# Patient Record
Sex: Male | Born: 1960 | Race: White | Hispanic: No | Smoking: Current every day smoker
Health system: Southern US, Community
[De-identification: ages and names within clinical notes are randomized; demographics above are authoritative.]

## PROBLEM LIST (undated history)

## (undated) DIAGNOSIS — E781 Pure hyperglyceridemia: Secondary | ICD-10-CM

## (undated) DIAGNOSIS — I10 Essential (primary) hypertension: Secondary | ICD-10-CM

## (undated) DIAGNOSIS — E786 Lipoprotein deficiency: Secondary | ICD-10-CM

## (undated) DIAGNOSIS — I2510 Atherosclerotic heart disease of native coronary artery without angina pectoris: Secondary | ICD-10-CM

## (undated) HISTORY — PX: CORONARY ARTERY BYPASS GRAFT: SHX141

## (undated) HISTORY — PX: CARDIAC SURGERY: SHX584

## (undated) HISTORY — PX: APPENDECTOMY: SHX54

---

## 1999-07-10 ENCOUNTER — Ambulatory Visit (HOSPITAL_BASED_OUTPATIENT_CLINIC_OR_DEPARTMENT_OTHER): Admission: RE | Admit: 1999-07-10 | Discharge: 1999-07-10 | Payer: Self-pay | Admitting: Orthopedic Surgery

## 2004-12-24 ENCOUNTER — Emergency Department (HOSPITAL_COMMUNITY): Admission: EM | Admit: 2004-12-24 | Discharge: 2004-12-24 | Payer: Self-pay | Admitting: Emergency Medicine

## 2004-12-26 ENCOUNTER — Encounter: Payer: Self-pay | Admitting: Emergency Medicine

## 2004-12-27 ENCOUNTER — Ambulatory Visit: Payer: Self-pay | Admitting: Cardiology

## 2004-12-27 ENCOUNTER — Encounter: Payer: Self-pay | Admitting: Cardiology

## 2004-12-27 ENCOUNTER — Inpatient Hospital Stay (HOSPITAL_COMMUNITY): Admission: AD | Admit: 2004-12-27 | Discharge: 2004-12-28 | Payer: Self-pay | Admitting: Neurology

## 2005-01-04 ENCOUNTER — Ambulatory Visit: Payer: Self-pay | Admitting: Family Medicine

## 2005-01-17 ENCOUNTER — Ambulatory Visit: Payer: Self-pay | Admitting: Family Medicine

## 2005-03-23 ENCOUNTER — Ambulatory Visit (HOSPITAL_COMMUNITY): Admission: RE | Admit: 2005-03-23 | Discharge: 2005-03-23 | Payer: Self-pay | Admitting: Interventional Radiology

## 2005-03-26 ENCOUNTER — Ambulatory Visit: Payer: Self-pay | Admitting: Family Medicine

## 2005-05-16 ENCOUNTER — Encounter (INDEPENDENT_AMBULATORY_CARE_PROVIDER_SITE_OTHER): Payer: Self-pay | Admitting: Cardiology

## 2005-05-16 ENCOUNTER — Ambulatory Visit (HOSPITAL_COMMUNITY): Admission: RE | Admit: 2005-05-16 | Discharge: 2005-05-16 | Payer: Self-pay | Admitting: Cardiology

## 2005-10-11 ENCOUNTER — Ambulatory Visit: Payer: Self-pay | Admitting: Family Medicine

## 2006-05-23 ENCOUNTER — Ambulatory Visit: Payer: Self-pay | Admitting: Family Medicine

## 2008-05-28 ENCOUNTER — Emergency Department (HOSPITAL_COMMUNITY): Admission: EM | Admit: 2008-05-28 | Discharge: 2008-05-29 | Payer: Self-pay | Admitting: Emergency Medicine

## 2010-06-06 NOTE — Op Note (Signed)
NAME:  ZAXTON, ANGERER NO.:  [id number]   MEDICAL RECORD NO.:  [id number]          PATIENT TYPE:  EMS   LOCATION:  MAJO                         FACILITY:  [facility]   PHYSICIAN:  Dionne Ano. Gramig, M.D.DATE OF BIRTH:  [birth year]   DATE OF PROCEDURE:  05/29/2008  DATE OF DISCHARGE:  05/29/2008                               OPERATIVE REPORT   It has been a pleasure to see Jevaughn Degollado in the [facility]  Emergency Room upon kind referral from Dr. Lorre Nick, emergency room  staff physician.  This patient presents after an on-the-job injury with  an open laceration to his thumb.  This laceration is deep and it runs  across the dorsal aspect and is in continuity with a fracture  classifying it as an open fracture.  I have discussed with the patient  these findings.  In addition to this, I have reviewed his past medical  history.  [place].   PAST MEDICAL HISTORY:  1. Hypercholesterolemia.  2. Appendectomy.  3. Two shoulder surgeries.   SOCIAL HISTORY:  He smokes and occasionally enjoys an alcoholic  beverage.  He does not use any type of drug.  He lives with his family  and he is an active gentleman, 50 years of age.   ALLERGIES:  None.   MEDICINES:  Crestor and aspirin p.r.n.   PHYSICAL EXAMINATION:  GENERAL:  He is alert and oriented, in no acute  distress.  VITAL SIGNS:  Stable.  EXTREMITIES:  Left upper extremity is neurovascularly intact.  Normal  arm, strength, and range of motion.  Lower extremity examination is  benign.  NECK AND BACK:  Nontender and stable.  CHEST:  Clear.  ABDOMEN:  Nontender.  He has no evidence of infection or dystrophic  reaction.  The right thumb has an obvious laceration with open fracture  and nail bed disarray.  I have reviewed this at length.  His x-ray show  a transverse fracture displaced about the distal phalanx.  This is  nonarticular.   IMPRESSION:  Open fracture, right thumb including nailbed injury.   PLAN:  I have discussed and verbally consented for repair and  reconstruction as necessary.  He understands and accepts the risks and  benefits surgery including risk of infection, bleeding, anesthesia,  damage to normal structures, and failure of surgery to accomplish his  intended goals of relieving symptoms and restoring function.   OPERATION IN DETAIL:  The patient was seen by myself and anesthesia and  taken to the operative arena.  I then performed an intermetacarpal and  field block.  He tolerated this well.  He was then prepped and draped  with two-step of Betadine scrub and paints followed by copious amounts  of 3 L of sterile saline, placed through the wound.  Once this was done,  sterile field was secured again and I then repeated the I and D, this  was an excisional debridement of skin, subcutaneous tissue, and bone.  Following this, the bone was reduced and Kirschner wire was  placed  across the fracture site and through the IP joint with finger in  extension.  This was checked under fluoro.  AP and lateral planes were  reviewed and verified and a permanent copies were saved for  documentation, this showed excellent position.  Thus I and D, excisional  in nature as well as ORIF of the bony fracture about the distal phalanx  and stress radiography were performed.   Following this, I then performed nailbed repair with fine chromic suture  under 4-point shoulder magnification, and in addition to this I  performed a complex dorsal laceration repair.  The patient tolerated  this well.  There were no complicating features.  He was placed in a  thumb spica splint.   DISCHARGE MEDICATIONS:  He was discharged home on:  1. Keflex 500 mg 1 p.o. q.i.d. x14 days.  2. Percocet 5 mg 1-2 q.4-6 h. p.r.n. pain p.o.  3. Vitamin C to promote healing.  4. I placed him on Peri-Colace for stool softener  issue.   I have asked him not to smoke.   She will be out of work until Jun 07, 2008, and he can begin live work  duty, sedentary in nature.  No use of the right upper extremity.  We  will see him in therapy for a wound check in 7 days, and I will see him  as well at that time.  The patient did not understand the postoperative  algorithm and course of care.  He tolerated the procedure well.  There  were no complicating features.  Hopefully, the patient will have a  smooth course.  This was an on-the-job injury, it was a fairly  significant open fracture and thus treated him for immediate detention.   PROCEDURES PERFORMED:  1. Incision and debridement of skin, subcutaneous tissue, and bone      excisional in nature, secondary to open fracture.  2. Open reduction and internal fixation, distal phalanx fracture,      Kirschner wire.  3. Nailbed repair, right thumb.  4. Complex tissue closure over a 3 cm area of right thumb.  5. Stress radiography.      Dionne Ano. Amanda Pea, M.D.  Electronically Signed     WMG/MEDQ  D:  05/29/2008  T:  05/29/2008  Job:  [id number]

## 2010-06-09 NOTE — Discharge Summary (Signed)
NAME:  Gabriel Collier, Gabriel Collier           ACCOUNT NO.:  [id number]   MEDICAL RECORD NO.:  [id number]          PATIENT TYPE:  INP   LOCATION:  4711                         FACILITY:  [facility]   PHYSICIAN:  Pramod P. Pearlean Brownie, MD    DATE OF BIRTH:  October 31, 1960   DATE OF ADMISSION:  12/27/2004  DATE OF DISCHARGE:  12/28/2004                                 DISCHARGE SUMMARY   DISCHARGE DIAGNOSIS:  1.  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  2.  Hyperlipidemia.  3.  Borderline diabetes.   HISTORY OF PRESENT ILLNESS:  Gabriel Collier is a 50 year old Caucasian male  who presented to the emergency room with symptoms of headache and left eye  visual disturbance for three days prior to admission. He was initially seen  at emergency room at [facility] where a CT scan of the head was  unremarkable. Lumbar puncture was also normal and he was sent home. He was  seen by Dr. Lysbeth Galas the next day who sent him to emergency room for another  MRI scan, which showed the above-mentioned strokes. He was admitted for  further workup. The patient's vascular risk factors included smoking one and  a half packs per day. On neurological exam, the patient complained of  subjective visual scatoma in the left eye in the upper outer quadrant but  had no objective field defect. He had no focal weakness, sensory loss or  ataxia. His gait was steady. MRI scan findings were stated above. A 2-D  echocardiogram showed normal ejection fraction. MRA of the neck showed  occluded left vertebral artery at its origin. There is a question of  dissection. Lipid profile showed an increased LDL of 124, total cholesterol  183, hemoglobin A1c was normal at 5.4. The patient was started on aspirin  and Plavix secondary to stroke prevention and was advised to follow-up in  the office with Dr. Pearlean Collier in two  months. At that time, cerebral angiogram  would be done to evaluate the vertebral artery for recanalization. If he had  been recurrent symptoms of posterior circulation ischemia, he was advised to  call earlier. The patient was advised not to drive until he had seen by an  eye doctor to check his visual fields. He was also advised to quit smoking  and started on Zocor for his elevated lipids.   MEDICATIONS AT THE TIME OF DISCHARGE:  1.  Aspirin 81 milligrams a day.  2.  Plavix 75 milligrams a day.  3.  Zocor 20 milligrams a day.   He is advised to follow-up with Dr. Pearlean Collier in two months and he was referred  to ophthalmologist, Dr. Raelyn Collier, for visual field testing. The patient  was not to drive until he saw Dr. Pearlean Collier.           ______________________________  Gabriel Schlein. Pearlean Brownie, MD     PPS/MEDQ  D:  03/12/2005  T:  03/12/2005  Job:  [id number]   cc:   Gabriel Collier  Gabriel Collier, M.D.  Fax: [phone]   Gabriel Collier, M.D.  Fax: [phone]

## 2010-06-09 NOTE — Op Note (Signed)
[place]  Patient:    Gabriel Collier, Gabriel Collier                  MRN: [id number] Proc. Date: 07/10/99 Adm. Date:  [id number] Disc. Date: [id number] Attending:  Twana First                           Operative Report  PREOPERATIVE DIAGNOSES: 1. Right shoulder rotator cuff tendinitis/partial tear. 2. Right shoulder acromioclavicular joint spurring and inflammation.  POSTOPERATIVE DIAGNOSES: 1. Right shoulder rotator cuff tendinitis/partial tear. 2. Right shoulder acromioclavicular joint spurring and inflammation.  PROCEDURE: 1. Right shoulder EUA followed by arthroscopic partial rotator cuff tear    debridement and decompression. 2. Right shoulder distal clavicle excision.  SURGEON:  Elana Alm. Thurston Hole, M.D.  ASSISTANT:  Kirstin Adelberger, P.A.  ANESTHESIA:  General.  OPERATIVE TIME:  Forty-five minutes.  COMPLICATIONS:  None.  INDICATIONS FOR PROCEDURE:  The patient is a 50 year old gentleman who initially injured his shoulder in 1999, working at [employer].  Has had persistent pain in the shoulder and underwent shoulder arthroscopic surgery in January of 2000, with a partial rotator cuff tear debridement and decompression. However, continued to have persistent pain despite this with a subsequent MRI shows AC joint inflammation and spurring, and tendinitis.  He has failed conservative and postoperative care, and is now to undergo arthroscopy.  DESCRIPTION OF PROCEDURE:  The patient is brought to the operating room on July 10, 1999, and placed on the operating table in the supine position. After an adequate level of general anesthesia was obtained, the right shoulder was examined under anesthesia.  He had full range of motion and the shoulder was stable to ligamentous exam.  The shoulder was prepped using sterile Betadine and draped using sterile technique.  Originally, through a posterior arthroscopic portal, the arthroscope with a pump  attached was placed, and through an anterior portal, an arthroscope probe was placed.  On initial inspection, the articular cartilage and the glenohumeral joint was normal, and the anterior and posterior labrum normal.  The superior labrum and biceps tendon anchor normal.  The biceps tendon normal.  Inferior labrum and anterior and inferior glenohumeral ligaments normal.  Inferior capsular recess free of pathology.  The rotator cuff showed a partial under surface tear, 20% of the supraspinatus which was partially debrided, but it was intact.  The rest of the rotator cuff was intact.  Subacromial space showed moderate return of bursitis which was resected.  The rotator cuff was intact, moderately frayed and through the lateral portal, I also palpated the cuff.  Again, there was some partial tearing, but no evidence of a complete tear and no need for a repair.  The previous subacromial decompression was found to be satisfactory. The distal clavicle was exposed and using a 6 mm bur, the distal 5-6 mm of the clavicle was resected secondary to spurring and inflammation in this joint.  After this was done, the shoulder could be brought through a full range of motion with no impingement on the rotator cuff.  At this point, I felt that all pathology had been satisfactorily addressed.  The instruments were removed.  The portals were closed with 3-0 nylon suture and injected with 0.25% Marcaine with epinephrine, and sterile dressings applied and a sling, and the patient awakened and taken to the recovery room in stable condition.  FOLLOWUP CARE:  The patient will be followed as an outpatient on  Vicodin and Naprosyn.  I will see him back in the office in a week for sutures out and follow up. DD:  07/10/99 TD:  07/12/99 Job: [id number]

## 2010-06-09 NOTE — H&P (Signed)
NAME:  Gabriel Collier NO.:  [id number]   MEDICAL RECORD NO.:  [id number]          PATIENT TYPE:  INP   LOCATION:  NA                           FACILITY:  [facility]   PHYSICIAN:  Marlan Palau, M.D.  DATE OF BIRTH:  [birth year]   DATE OF ADMISSION:  DATE OF DISCHARGE:                                HISTORY & PHYSICAL   HISTORY OF PRESENT ILLNESS:  [name] is a 50 year old, right  handed, white male, born Jul 09, 1960, with a history of tobacco abuse.  This patient is followed by Dr. Lysbeth Galas and two days ago developed sudden  onset of an occipital headache which is unusual for him.  The patient went  to the emergency room at [facility], underwent a CT scan of the  head that was unremarkable, a lumbar puncture that was unremarkable.  Headache, however, persisted with visual scotoma, a bit more prominent off  to the left than to the right.  The patient was seen by Dr. Lysbeth Galas and was  told to return to the emergency room for an MRI scan of the brain which was  performed.  This showed evidence of left thalamic, left vermian  cerebellar,  and right occipital acute strokes.  MRI angiography was not done with the  study.  Neurology was asked to see this patient for further evaluation.  The  patient has not been on aspirin or any other blood thinners prior to this  admission.  Headache again persists.  The patient did have some nausea,  vomiting initially with the headache, has not reported any numbness or  weakness in the arms or legs.  The patient denies any gait disturbance.   PAST MEDICAL HISTORY:  1.  New onset of cerebrovascular infarctions as above.  2.  Right shoulder surgery in the past.  3.  History of appendectomy.  4.  Borderline diabetes.  5.  Headache as above.   MEDICATIONS:  None.   The patient smokes a pack and a half of cigarettes a day.  He drinks alcohol  on occasion in the form of beer.   He states no known allergies.   SOCIAL HISTORY:  The patient is married, lives in the [place], [place] area, works as a Naval architect, has two children who are alive and  well.   FAMILY MEDICAL HISTORY:  Notable that father had an MI.  Mother had breast  cancer at the time of death.  He has one brother who is alive and well.   REVIEW OF SYSTEMS:  Noted for no fevers or chills.  The patient does note  some neck pain but no neck stiffness.  Denies shortness of breath, chest  pain, abdominal pain, blackout episodes, dizziness.  Some dizziness was  noted at the onset of the headache.   PHYSICAL EXAMINATION:  VITAL SIGNS:  Blood pressure is 122/69, heart rate  58, respiratory rate 17, temperature afebrile.  GENERAL:  This patient is a well developed white male who is alert,  cooperative on examination.  HEENT:  Head is atraumatic.  Eyes:  Pupils are equal, round,  reactive to  light.  Disks are flat bilaterally.  NECK:  Supple.  No carotid bruits noted.  RESPIRATORY:  Clear.  CARDIOVASCULAR:  Reveals a regular rate and rhythm.  No obvious murmurs rubs  noted.  EXTREMITIES:  Without significant edema.  NEUROLOGIC:  Cranial nerves as above.  Facial symmetry is present.  The  patient has good sensation of the face to pinprick, soft touch bilaterally.  He has good strength of the facial muscles, muscles for head turning,  shoulder shrug bilaterally.  Speech is well enunciated, not aphasic.  Motor  testing reveals 5/5 strength in all fours.  Good symmetric motor tone is  noted throughout.  Sensory testing is intact to pinprick, soft touch,  vibratory sensation throughout.  Position sense is intact to all fours.  No  evidence of extinction  is noted.  The patient has good finger-to-nose-  finger, toe-to-finger bilaterally.  Gait was not tested.  Deep tendon  reflexes symmetric normal.  Toes downgoing bilaterally.   LABORATORY VALUES:  Notable for a white count of 8.5, hemoglobin of 14.7,  hematocrit of 42.8, MCV of  92.6, platelets of 229.  INR of 1.0.  Sodium 137,  potassium 3.9, chloride of 105, CO2 of 26, glucose of 121, BUN of 12,  creatinine 0.9.  Total bili 0.6, alk phosphatase 45, SGOT 14, SGPT 21, total  protein 6.5, albumin of 3.6, calcium of 9.1.  Lipase of 22.   Chest x-ray and EKG are pending.   IMPRESSION:  1.  Bilateral posterior circulation cerebrovascular infarctions.  2.  Headache.  The patient had onset of symptoms associated with headache.      Do need to consider the possibility of a vertebral dissection, rule out      cardiogenic embolus to the posterior circulation.  __________  the      patient has had bilateral acute infarctions.  Further workup is      indicated.   PLAN:  1.  Admission to [facility].  2.  MRI angiogram of the intracranial, extra cranial vessels, consider      cerebral angiogram.  3.  A 2D echocardiogram.  4.  Aspirin therapy.  5.  IV fluids.   We will follow the patient's clinical course while in-house.  Thank you very  much.      Marlan Palau, M.D.  Electronically Signed     CKW/MEDQ  D:  12/26/2004  T:  12/26/2004  Job:  [id number]   cc:   Delaney Meigs, M.D.  Fax: [phone]

## 2013-02-11 ENCOUNTER — Emergency Department (HOSPITAL_COMMUNITY): Payer: Self-pay

## 2013-02-11 ENCOUNTER — Emergency Department (HOSPITAL_COMMUNITY)
Admission: EM | Admit: 2013-02-11 | Discharge: 2013-02-11 | Disposition: A | Discharge status: Home / Self Care | Payer: Self-pay | Attending: Emergency Medicine | Admitting: Emergency Medicine

## 2013-02-11 ENCOUNTER — Encounter (HOSPITAL_COMMUNITY): Payer: Self-pay | Admitting: Emergency Medicine

## 2013-02-11 DIAGNOSIS — E786 Lipoprotein deficiency: Secondary | ICD-10-CM | POA: Insufficient documentation

## 2013-02-11 DIAGNOSIS — F17200 Nicotine dependence, unspecified, uncomplicated: Secondary | ICD-10-CM | POA: Insufficient documentation

## 2013-02-11 DIAGNOSIS — R55 Syncope and collapse: Secondary | ICD-10-CM | POA: Insufficient documentation

## 2013-02-11 DIAGNOSIS — Z951 Presence of aortocoronary bypass graft: Secondary | ICD-10-CM | POA: Insufficient documentation

## 2013-02-11 DIAGNOSIS — R296 Repeated falls: Secondary | ICD-10-CM | POA: Insufficient documentation

## 2013-02-11 DIAGNOSIS — Z79899 Other long term (current) drug therapy: Secondary | ICD-10-CM | POA: Insufficient documentation

## 2013-02-11 DIAGNOSIS — S0100XA Unspecified open wound of scalp, initial encounter: Secondary | ICD-10-CM | POA: Insufficient documentation

## 2013-02-11 DIAGNOSIS — Z9889 Other specified postprocedural states: Secondary | ICD-10-CM | POA: Insufficient documentation

## 2013-02-11 DIAGNOSIS — I2510 Atherosclerotic heart disease of native coronary artery without angina pectoris: Secondary | ICD-10-CM | POA: Insufficient documentation

## 2013-02-11 DIAGNOSIS — I10 Essential (primary) hypertension: Secondary | ICD-10-CM | POA: Insufficient documentation

## 2013-02-11 DIAGNOSIS — Z7982 Long term (current) use of aspirin: Secondary | ICD-10-CM | POA: Insufficient documentation

## 2013-02-11 DIAGNOSIS — Y939 Activity, unspecified: Secondary | ICD-10-CM | POA: Insufficient documentation

## 2013-02-11 DIAGNOSIS — E781 Pure hyperglyceridemia: Secondary | ICD-10-CM | POA: Insufficient documentation

## 2013-02-11 DIAGNOSIS — Y929 Unspecified place or not applicable: Secondary | ICD-10-CM | POA: Insufficient documentation

## 2013-02-11 DIAGNOSIS — R42 Dizziness and giddiness: Secondary | ICD-10-CM | POA: Insufficient documentation

## 2013-02-11 HISTORY — DX: Lipoprotein deficiency: E78.6

## 2013-02-11 HISTORY — DX: Essential (primary) hypertension: I10

## 2013-02-11 HISTORY — DX: Pure hyperglyceridemia: E78.1

## 2013-02-11 HISTORY — DX: Atherosclerotic heart disease of native coronary artery without angina pectoris: I25.10

## 2013-02-11 LAB — PROTIME-INR
INR: 0.94 (ref 0.00–1.49)
Prothrombin Time: 12.4 seconds (ref 11.6–15.2)

## 2013-02-11 LAB — COMPREHENSIVE METABOLIC PANEL
ALT: 17 U/L (ref 0–53)
AST: 14 U/L (ref 0–37)
Albumin: 4.1 g/dL (ref 3.5–5.2)
Alkaline Phosphatase: 68 U/L (ref 39–117)
BILIRUBIN TOTAL: 0.4 mg/dL (ref 0.3–1.2)
BUN: 10 mg/dL (ref 6–23)
CALCIUM: 9.1 mg/dL (ref 8.4–10.5)
CHLORIDE: 99 meq/L (ref 96–112)
CO2: 23 meq/L (ref 19–32)
CREATININE: 0.71 mg/dL (ref 0.50–1.35)
GFR calc Af Amer: >90 mL/min (ref >90)
Glucose, Bld: 114 mg/dL — ABNORMAL HIGH (ref 70–99)
Potassium: 4.5 mEq/L (ref 3.7–5.3)
Sodium: 136 mEq/L — ABNORMAL LOW (ref 137–147)
Total Protein: 7.3 g/dL (ref 6.0–8.3)

## 2013-02-11 LAB — CBC WITH DIFFERENTIAL/PLATELET
BASOS ABS: 0 10*3/uL (ref 0.0–0.1)
BASOS PCT: 0 % (ref 0–1)
EOS PCT: 0 % (ref 0–5)
Eosinophils Absolute: 0.1 10*3/uL (ref 0.0–0.7)
HEMATOCRIT: 43.9 % (ref 39.0–52.0)
HEMOGLOBIN: 15.9 g/dL (ref 13.0–17.0)
LYMPHS PCT: 7 % — AB (ref 12–46)
Lymphs Abs: 1.2 10*3/uL (ref 0.7–4.0)
MCH: 33.3 pg (ref 26.0–34.0)
MCHC: 36.2 g/dL — AB (ref 30.0–36.0)
MCV: 91.8 fL (ref 78.0–100.0)
MONO ABS: 0.8 10*3/uL (ref 0.1–1.0)
MONOS PCT: 5 % (ref 3–12)
Neutro Abs: 15.4 10*3/uL — ABNORMAL HIGH (ref 1.7–7.7)
Neutrophils Relative %: 88 % — ABNORMAL HIGH (ref 43–77)
Platelets: 235 10*3/uL (ref 150–400)
RBC: 4.78 MIL/uL (ref 4.22–5.81)
RDW: 12.6 % (ref 11.5–15.5)
WBC: 17.5 10*3/uL — ABNORMAL HIGH (ref 4.0–10.5)

## 2013-02-11 LAB — URINALYSIS, ROUTINE W REFLEX MICROSCOPIC
BILIRUBIN URINE: NEGATIVE
GLUCOSE, UA: NEGATIVE mg/dL
HGB URINE DIPSTICK: NEGATIVE
KETONES UR: NEGATIVE mg/dL
Leukocytes, UA: NEGATIVE
Nitrite: NEGATIVE
PH: 5.5 (ref 5.0–8.0)
PROTEIN: NEGATIVE mg/dL
Specific Gravity, Urine: 1.011 (ref 1.005–1.030)
Urobilinogen, UA: 0.2 mg/dL (ref 0.0–1.0)

## 2013-02-11 LAB — TYPE AND SCREEN
ABO/RH(D): B POS
ANTIBODY SCREEN: NEGATIVE

## 2013-02-11 LAB — POCT I-STAT TROPONIN I: Troponin i, poc: 0 ng/mL (ref 0.00–0.08)

## 2013-02-11 LAB — ABO/RH: ABO/RH(D): B POS

## 2013-02-11 NOTE — ED Provider Notes (Signed)
Medical screening examination/treatment/procedure(s) were performed by non-physician practitioner and as supervising physician I was immediately available for consultation/collaboration.  EKG Interpretation    Date/Time:  Wednesday February 11 2013 10:35:16 EST Ventricular Rate:  56 PR Interval:  154 QRS Duration: 84 QT Interval:  405 QTC Calculation: 391 R Axis:   88 Text Interpretation:  Sinus rhythm Probable anterior infarct, age indeterminate No significant change since last tracing Confirmed by Janiyla Long  MD-J, Kelin Borum ([id number]) on 02/11/2013 11:28:01 AM              Celene KrasJon R Cristel Rail, MD 02/11/13 1128

## 2013-02-11 NOTE — ED Notes (Signed)
Patient transported to X-ray 

## 2013-02-11 NOTE — ED Provider Notes (Signed)
CSN: [id number]     Arrival date & time 02/11/13  [id number] History   First MD Initiated Contact with Patient 02/11/13 1002     Chief Complaint  Patient presents with  . Loss of Consciousness  . Fall   (Consider location/radiation/quality/duration/timing/severity/associated sxs/prior Treatment) HPI Comments: Patient is 53 year old male who presents to the ED after having syncopal event at work today.  He has a PMHx significant for CAD with CABG in 2012, HTN and hyperlipidemia.  He states that he did not have breakfast this morning and had two cups of coffee with sugar.  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  He states the next thing he remembers is waking up on the ground.  He reports no history of syncope in the past.  He struck his right parietal scalp on the concrete during this event but denies headache or neck pain.  He denies headache, blurred vision, history of seizures, nausea, vomiting, recent illness, neck pain, back pain, chest pain, shortness of breath, abdominal pain, melena or BRBPR, dysuria, hematuria, diarrhea, constipation.  He reports that all of his medical care is done in [place] and [facility].  Patient is a 53 y.o. male presenting with syncope and fall. The history is provided by the patient. No language interpreter was used.  Loss of Consciousness Episode history:  Single Most recent episode:  Today Duration:  30 seconds Timing:  Rare Progression:  Resolved Chronicity:  New Context: normal activity   Context: not blood draw, not dehydration, not exertion, not medication change, not sitting down, not standing up and not urination   Witnessed: yes   Relieved by:  Nothing Worsened by:  Nothing tried Ineffective treatments:  None tried Associated symptoms: dizziness   Associated symptoms: no anxiety, no chest pain, no  confusion, no diaphoresis, no fever, no focal sensory loss, no focal weakness, no headaches, no nausea, no palpitations, no recent fall, no recent injury, no recent surgery, no rectal bleeding, no seizures, no shortness of breath, no vomiting and no weakness   Risk factors: coronary artery disease   Risk factors: no seizures and no vascular disease   Fall Pertinent negatives include no chest pain, diaphoresis, fever, headaches, nausea, vomiting or weakness.    Past Medical History  Diagnosis Date  . Hypertension   . 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 Coronary artery disease    Past Surgical History  Procedure Laterality Date  . Coronary artery bypass graft      triple  . Appendectomy    . Cardiac surgery     History reviewed. No pertinent family history. History  Substance Use Topics  . Smoking status: Current Every Day Smoker -- 1.00 packs/day    Types: Cigarettes  . Smokeless tobacco: Never Used  . Alcohol Use: 3.0 oz/week    5 Cans of beer per week    Review of Systems  Constitutional: Negative for fever and diaphoresis.  Respiratory: Negative for shortness of breath.   Cardiovascular: Positive for syncope. Negative for chest pain and palpitations.  Gastrointestinal: Negative for nausea and vomiting.  Neurological: Positive for dizziness. Negative for focal weakness, seizures, weakness and headaches.  Psychiatric/Behavioral: Negative for confusion.  All other systems reviewed and are negative.    Allergies  Review of patient's allergies indicates no known allergies.  Home  Medications   Current Outpatient Rx  Name  Route  Sig  Dispense  Refill  . aspirin 325 MG EC tablet   Oral   Take 325 mg by mouth daily.         . metoprolol (LOPRESSOR) 50 MG tablet   Oral   Take 25 mg by mouth 2 (two) times daily.         . pravastatin (PRAVACHOL) 40 MG tablet   Oral   Take 40 mg by mouth daily.          BP  145/71  Pulse 78  Temp(Src) 97.8 F (36.6 C) (Oral)  Resp 21  SpO2 97% Physical Exam  Nursing note and vitals reviewed. Constitutional: He is oriented to person, place, and time. He appears well-developed and well-nourished. No distress.  HENT:  Head: Normocephalic.  Right Ear: External ear normal.  Left Ear: External ear normal.  Nose: Nose normal.  Mouth/Throat: Oropharynx is clear and moist. No oropharyngeal exudate.  Small (0.25cm) puncture wound to right parietal scalp, no scalp tenderness to palpation.  Eyes: Conjunctivae and EOM are normal. Pupils are equal, round, and reactive to light. No scleral icterus.  Neck: Normal range of motion and full passive range of motion without pain. Neck supple. No spinous process tenderness and no muscular tenderness present.  Cardiovascular: Normal rate, regular rhythm, normal heart sounds and intact distal pulses.  Exam reveals no gallop and no friction rub.   No murmur heard. Pulmonary/Chest: Effort normal and breath sounds normal. No respiratory distress. He has no wheezes. He has no rales. He exhibits no tenderness.  Well healed midline sternal scar  Abdominal: Soft. Bowel sounds are normal. He exhibits no distension and no mass. There is no tenderness. There is no rebound and no guarding.  Musculoskeletal: Normal range of motion. He exhibits no edema and no tenderness.  Lymphadenopathy:    He has no cervical adenopathy.  Neurological: He is alert and oriented to person, place, and time. No cranial nerve deficit. He exhibits normal muscle tone. Coordination normal.  Skin: Skin is warm and dry. No rash noted. No erythema. No pallor.  Psychiatric: He has a normal mood and affect. His behavior is normal. Judgment and thought content normal.    ED Course  Procedures (including critical care time) Labs Review Labs Reviewed - No data to display Imaging Review No results found.  EKG Interpretation   None      11:24 AM Care of patient  turned over to A. Harris, PA-C who will finish note and disposition of the patient. MDM      Izola Price Marisue Humble, PA-C 02/11/13 1125

## 2013-02-11 NOTE — ED Notes (Signed)
[place] [employer] [place] [facility] with c/o LOC and seizure like activity reported by co-workers.  Pt states he remembers getting dizzy but does not remember anything else.  Guys at site said pt hit his head on the edge of concrete slab.  Pt in NAD, A&O.

## 2013-02-11 NOTE — ED Provider Notes (Signed)
Medical screening examination/treatment/procedure(s) were performed by non-physician practitioner and as supervising physician I was immediately available for consultation/collaboration.  EKG Interpretation    Date/Time:  Wednesday February 11 2013 10:35:16 EST Ventricular Rate:  56 PR Interval:  154 QRS Duration: 84 QT Interval:  405 QTC Calculation: 391 R Axis:   88 Text Interpretation:  Sinus rhythm Probable anterior infarct, age indeterminate No significant change since last tracing Confirmed by Pasqualina Colasurdo  MD-J, Katalin Colledge ([id number]) on 02/11/2013 11:28:01 AM              Celene KrasJon R Angeliki Mates, MD 02/11/13 1544

## 2013-02-11 NOTE — Discharge Instructions (Signed)
Syncope Syncope is a fainting spell. This means the person loses consciousness and drops to the ground. The person is generally unconscious for less than 5 minutes. The person may have some muscle twitches for up to 15 seconds before waking up and returning to normal. Syncope occurs more often in elderly people, but it can happen to anyone. While most causes of syncope are not dangerous, syncope can be a sign of a serious medical problem. It is important to seek medical care.  CAUSES  Syncope is caused by a sudden decrease in blood flow to the brain. The specific cause is often not determined. Factors that can trigger syncope include:  Taking medicines that lower blood pressure.  Sudden changes in posture, such as standing up suddenly.  Taking more medicine than prescribed.  Standing in one place for too long.  Seizure disorders.  Dehydration and excessive exposure to heat.  Low blood sugar (hypoglycemia).  Straining to have a bowel movement.  Heart disease, irregular heartbeat, or other circulatory problems.  Fear, emotional distress, seeing blood, or severe pain. SYMPTOMS  Right before fainting, you may:  Feel dizzy or lightheaded.  Feel nauseous.  See all white or all black in your field of vision.  Have cold, clammy skin. DIAGNOSIS  Your caregiver will ask about your symptoms, perform a physical exam, and perform electrocardiography (ECG) to record the electrical activity of your heart. Your caregiver may also perform other heart or blood tests to determine the cause of your syncope. TREATMENT  In most cases, no treatment is needed. Depending on the cause of your syncope, your caregiver may recommend changing or stopping some of your medicines. HOME CARE INSTRUCTIONS  Have someone stay with you until you feel stable.  Do not drive, operate machinery, or play sports until your caregiver says it is okay.  Keep all follow-up appointments as directed by your  caregiver.  Lie down right away if you start feeling like you might faint. Breathe deeply and steadily. Wait until all the symptoms have passed.  Drink enough fluids to keep your urine clear or pale yellow.  If you are taking blood pressure or heart medicine, get up slowly, taking several minutes to sit and then stand. This can reduce dizziness. SEEK IMMEDIATE MEDICAL CARE IF:   You have a severe headache.  You have unusual pain in the chest, abdomen, or back.  You are bleeding from the mouth or rectum, or you have black or tarry stool.  You have an irregular or very fast heartbeat.  You have pain with breathing.  You have repeated fainting or seizure-like jerking during an episode.  You faint when sitting or lying down.  You have confusion.  You have difficulty walking.  You have severe weakness.  You have vision problems. If you fainted, call your local emergency services (911 in [place] Do not drive yourself to the hospital.  MAKE SURE YOU:  Understand these instructions.  Will watch your condition.  Will get help right away if you are not doing well or get worse. Document Released: 01/08/2005 Document Revised: 07/10/2011 Document Reviewed: 03/09/2011 [facility] Patient Information 2014 [place], [place].  Driving and Equipment Restrictions Some medical problems make it dangerous to drive, ride a bike, or use machines. Some of these problems are:  A hard blow to the head (concussion).  Passing out (fainting).  Twitching and shaking (seizures).  Low blood sugar.  Taking medicine to help you relax (sedatives).  Taking pain medicines.  Wearing an eye  patch.  Wearing splints. This can make it hard to use parts of your body that you need to drive safely. HOME CARE   Do not drive until your doctor says it is okay.  Do not use machines until your doctor says it is okay. You may need a form signed by your doctor (medical release) before you can drive again. You  may also need this form before you do other tasks where you need to be fully alert. MAKE SURE YOU:  Understand these instructions.  Will watch your condition.  Will get help right away if you are not doing well or get worse. Document Released: 02/16/2004 Document Revised: 04/02/2011 Document Reviewed: 05/18/2009 [facility] Patient Information 2014 [place].

## 2013-02-11 NOTE — ED Provider Notes (Signed)
11:00 AM [facility] Here for syncopal event. Patient at work doing heavy lifting. Prodrome of dizziness before syncope and collapse.  The patiient has a history of CAD.  Previous triple coronary bypass.  The patient had a previous episode of syncope and collapse one year ago.  He was seen at the 4 site emergency department and released.  Patient did follow up with his cardiologist and neurologist was cleared.  He should reports that he did not eat any food prior to both episodes of syncope.   1:08 PM BP 142/72  Pulse 66  Temp(Src) 97.8 F (36.6 C) (Oral)  Resp 15  SpO2 100% Filed Vitals:   02/11/13 1244 02/11/13 1300 02/11/13 1336 02/11/13 1337  BP:  127/60 121/66   Pulse:  69  66  Temp:      TempSrc:      Resp:      SpO2: 100% 99%  99%    Patient work up negative. I have discussed the case with Dr. Lynelle DoctorKnapp and the patient. As he has had previous work up and followed up appropriately I feel the patient may do the same this time. He is low risk on [place]. Syncope score.  Driving/ equip restrictions given.  Patient is to follow up with his pcp/ cardio/neuro. Return precautions discussed.  Arthor CaptainAbigail Darryll Raju, PA-C 02/11/13 1527

## 2015-07-15 IMAGING — CT CT HEAD W/O CM
2 series · 16 of 30 positions shown, 18 images · non-contrast
Comparison: Brain CT December 24, 2004 and brain MRI December 26, 2004

CLINICAL DATA: Loss of consciousness with fall

EXAM:
CT HEAD WITHOUT CONTRAST
TECHNIQUE: Contiguous axial images were obtained from the base of the skull
through the vertex without intravenous contrast.

[Series 2: head w/o · axial · non-contrast · 0.43mm/px · z∈[+78,+203]mm · 8 of 33 slices shown, 10 images]
[im 4/33  brain]
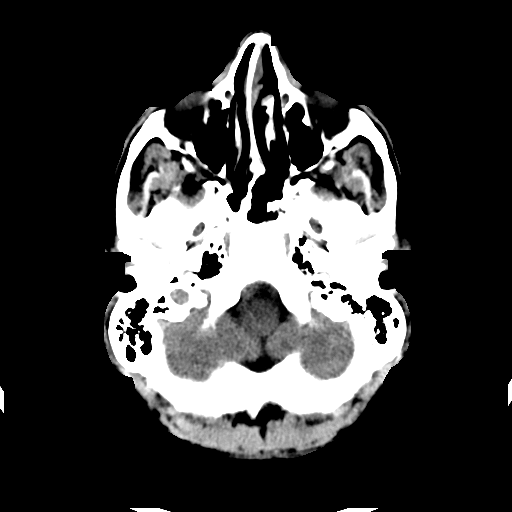
[im 4/33  bone]
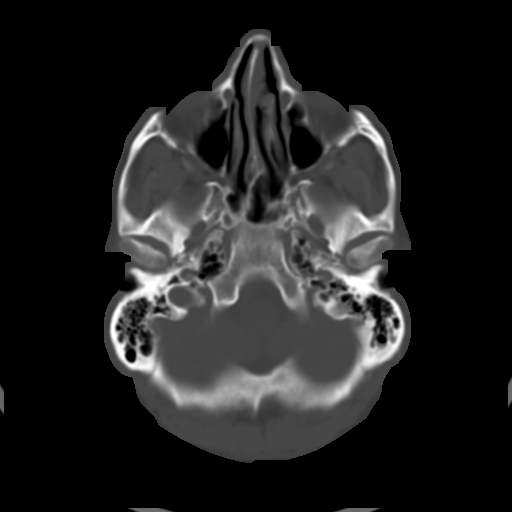
[im 8/33  brain]
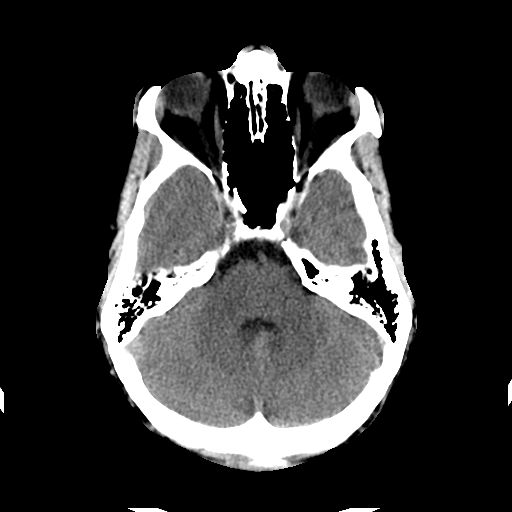
[im 11/33  brain]
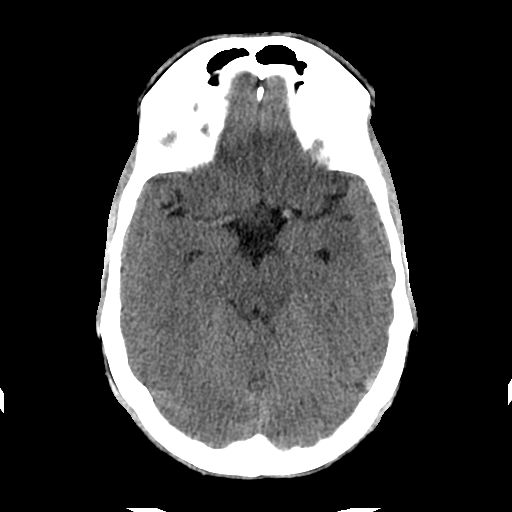
[im 15/33  brain]
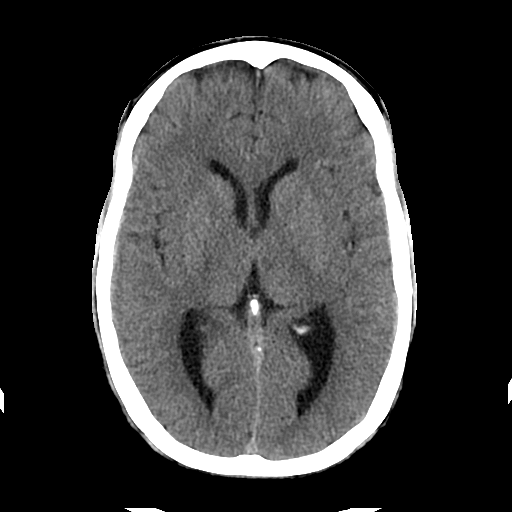
[im 18/33  brain]
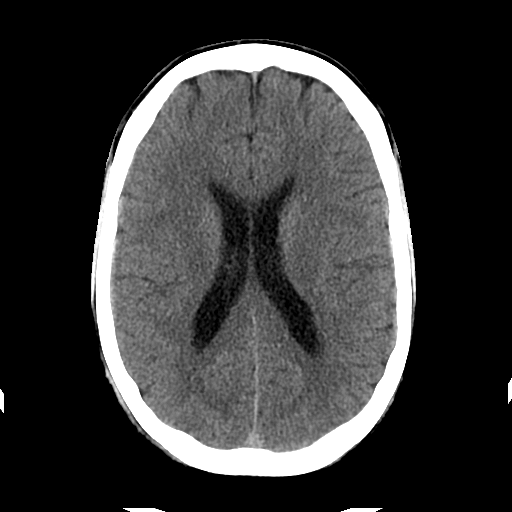
[im 18/33  bone]
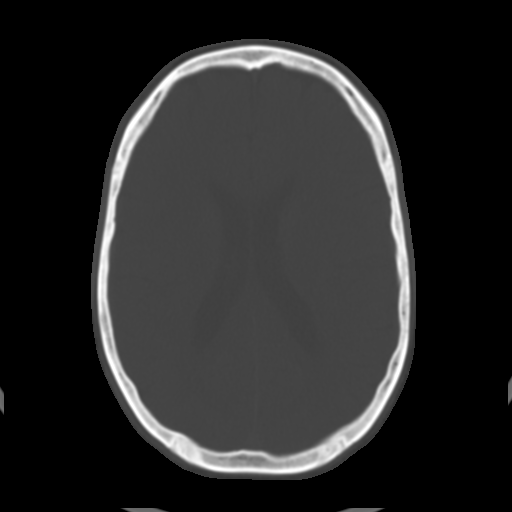
[im 22/33  brain]
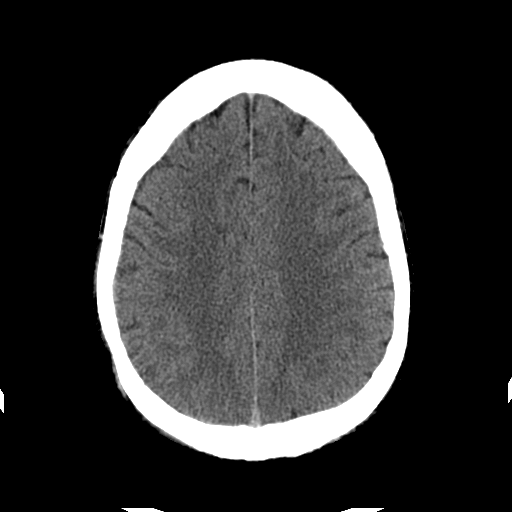
[im 25/33  brain]
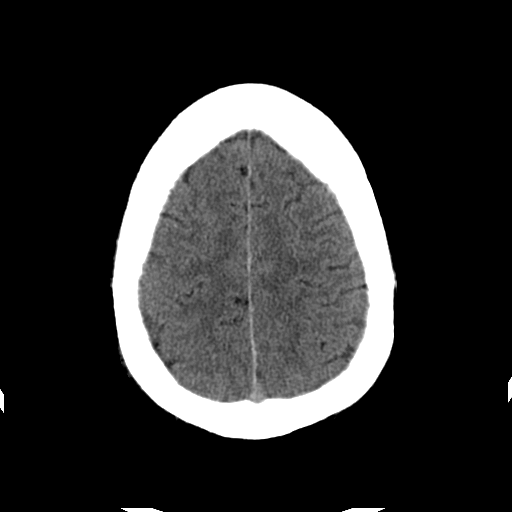
[im 29/33  brain]
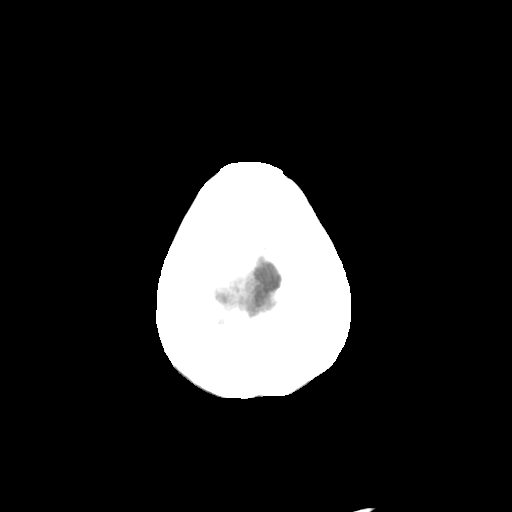

[Series 3: head w/o bone · axial · non-contrast · 0.43mm/px · z∈[+78,+205]mm · 8 of 65 slices shown]
[im 7/65  bone]
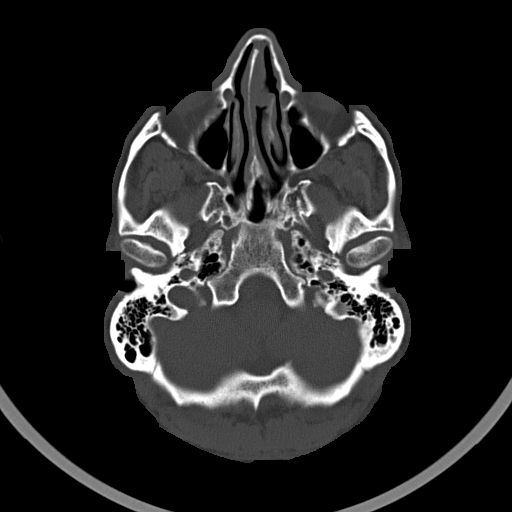
[im 14/65  bone]
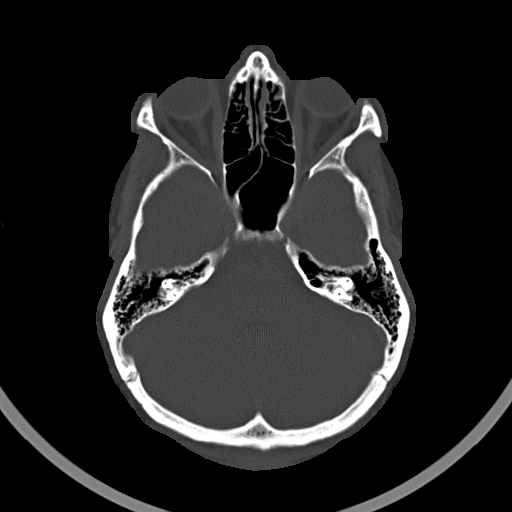
[im 21/65  bone]
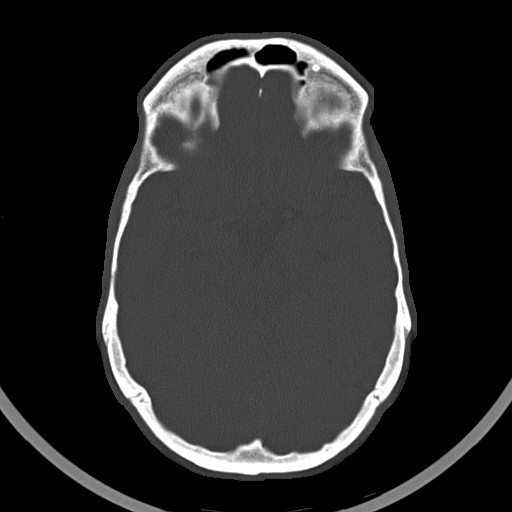
[im 27/65  bone]
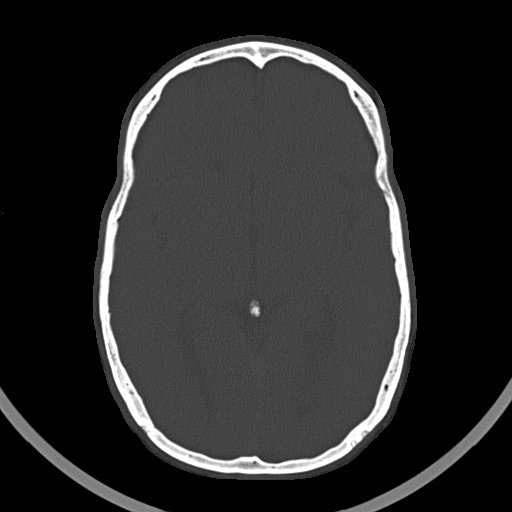
[im 38/65  bone]
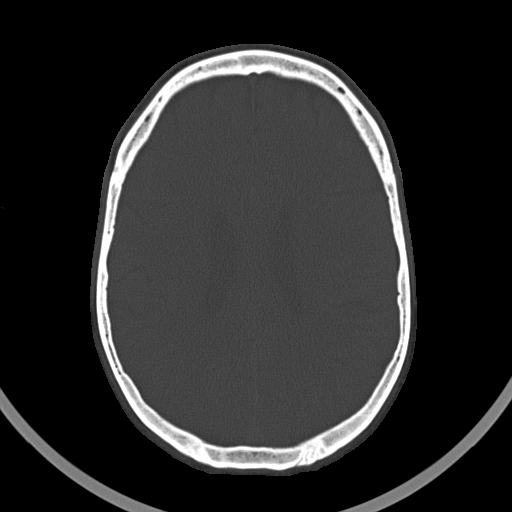
[im 44/65  bone]
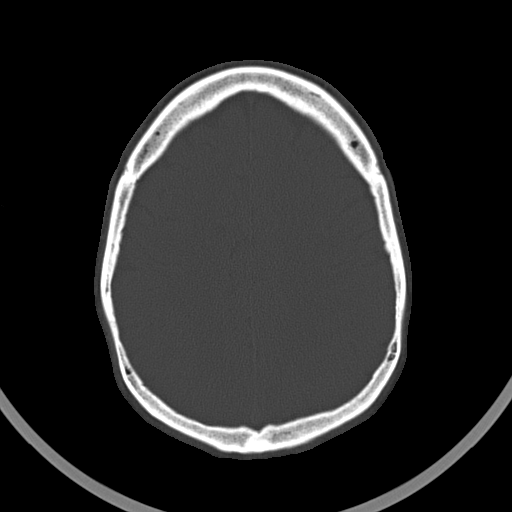
[im 51/65  bone]
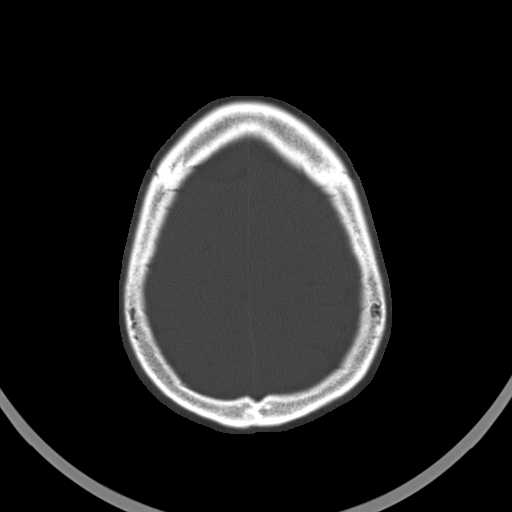
[im 58/65  bone]
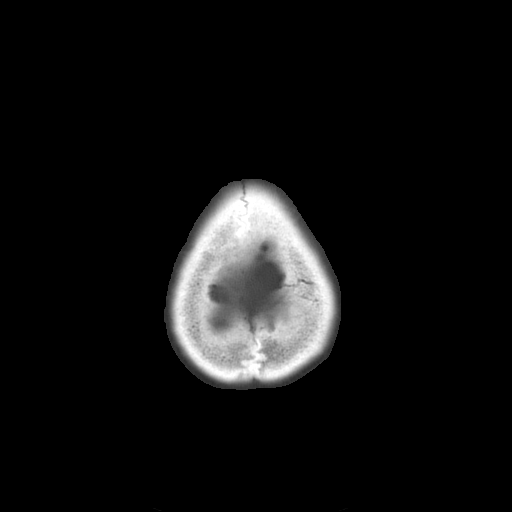

[16 of 30 positions shown; findings below may reference images not displayed]

FINDINGS: The ventricles are normal in size and configuration. There is no
mass, hemorrhage, extra-axial fluid collection, or midline shift.

There is decreased attenuation in the anterior left thalamus in an
area of prior infarct. Subtle decreased attenuation is noted in the
post rim medial superior right cerebellum in an area of documented
prior infarct. There is also a minimal decreased attenuation just
posterior to the quadrigeminal plate cistern on the left consistent
with prior infarct demonstrated previously. No new gray-white
compartment lesions are identified. There is no demonstrable acute
infarct on this study.

The bony calvarium appears intact. The mastoid air cells are clear.
There is rightward deviation of the nasal septum. There is a
questionable polyp in the left naris.
IMPRESSION: There are prior small infarcts as noted above. There is no
intracranial mass, hemorrhage, or acute appearing infarct.

Suspect left-sided nasal polyp causing a localized obstruction of
the left naris. This area may warrant direct visualization. There is
rightward deviation of the nasal septum.

## 2015-07-15 IMAGING — CR DG CHEST 2V
2 series · 2 of 2 positions shown · non-contrast
Comparison: December 27, 2004

CLINICAL DATA: Loss of consciousness with trauma

EXAM:
CHEST  2 VIEW

[w chest lat]
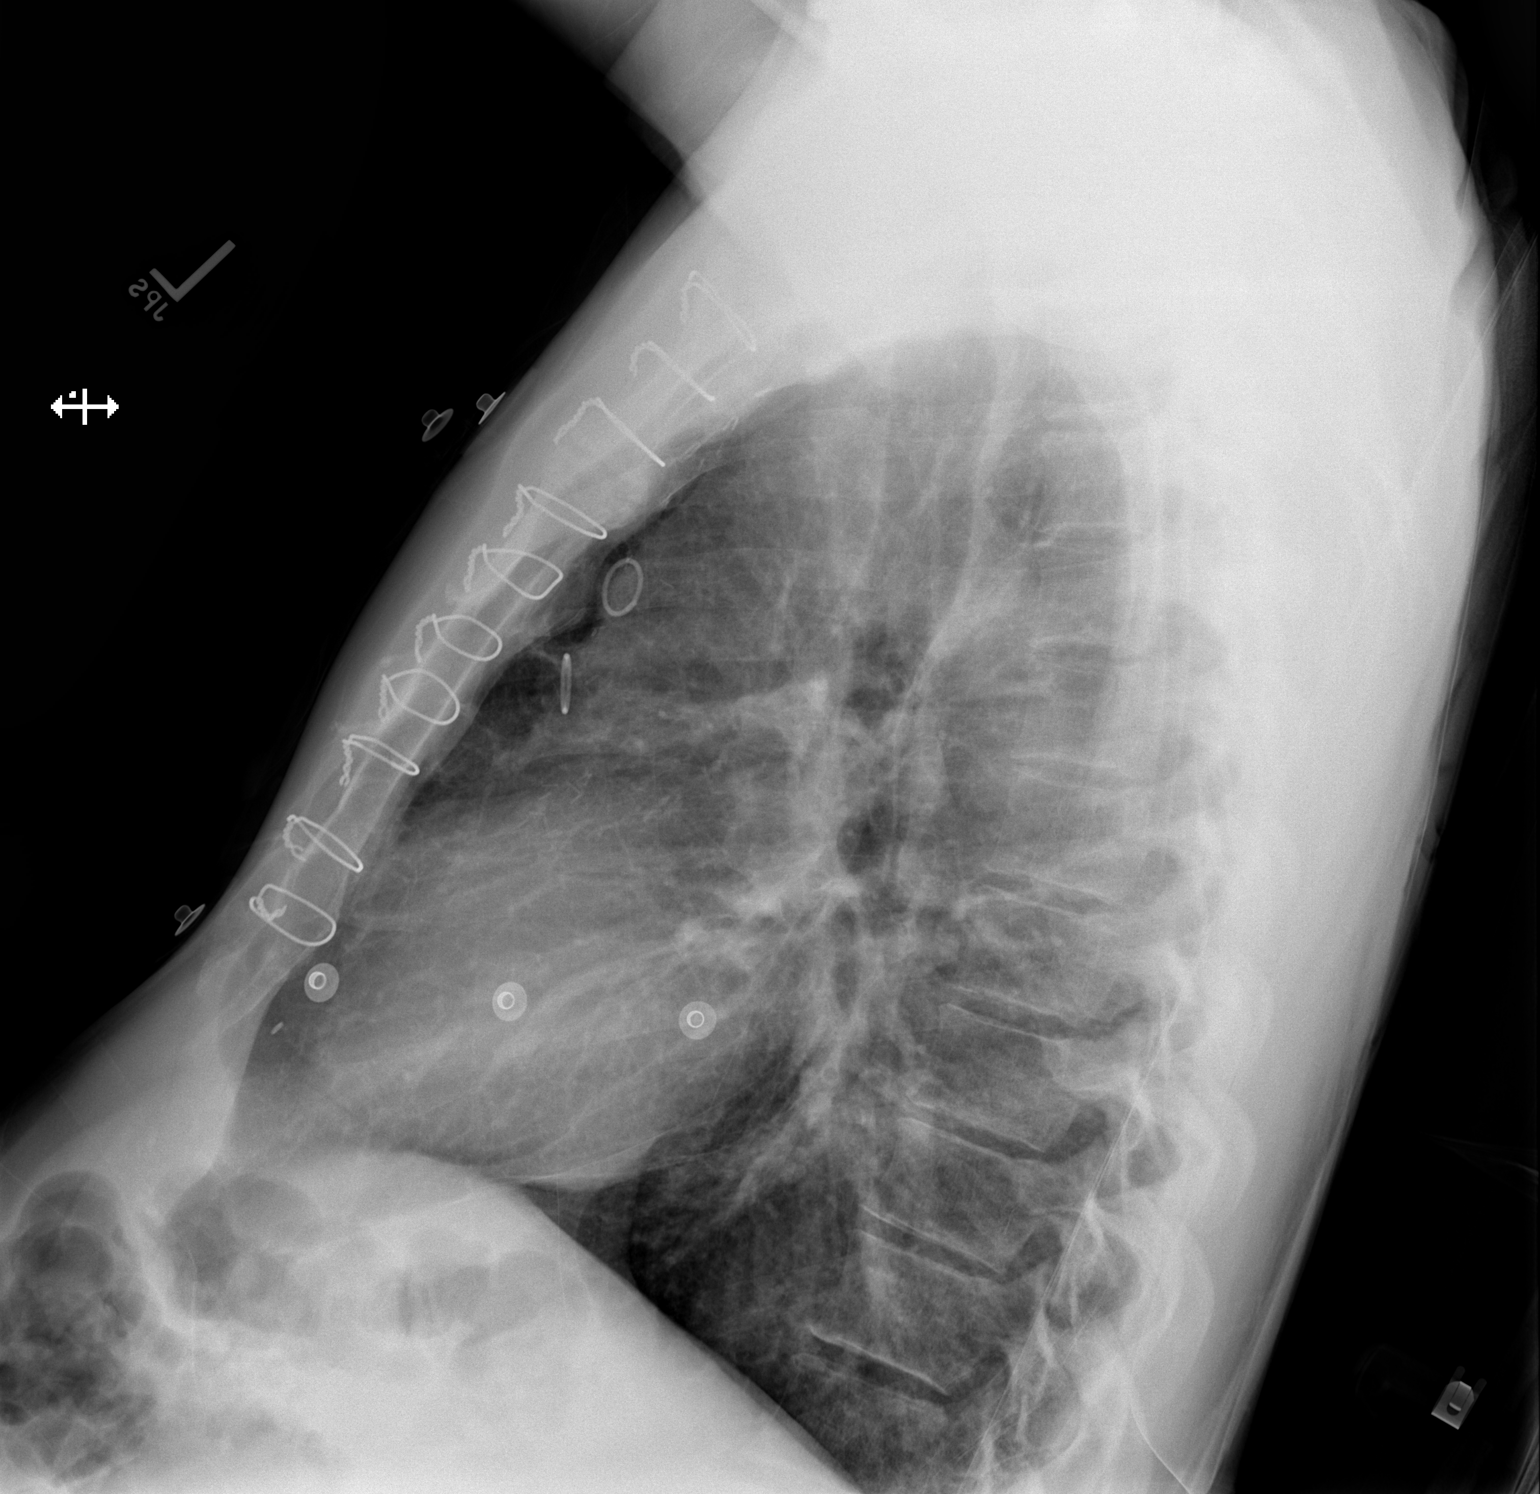

[w chest pa]
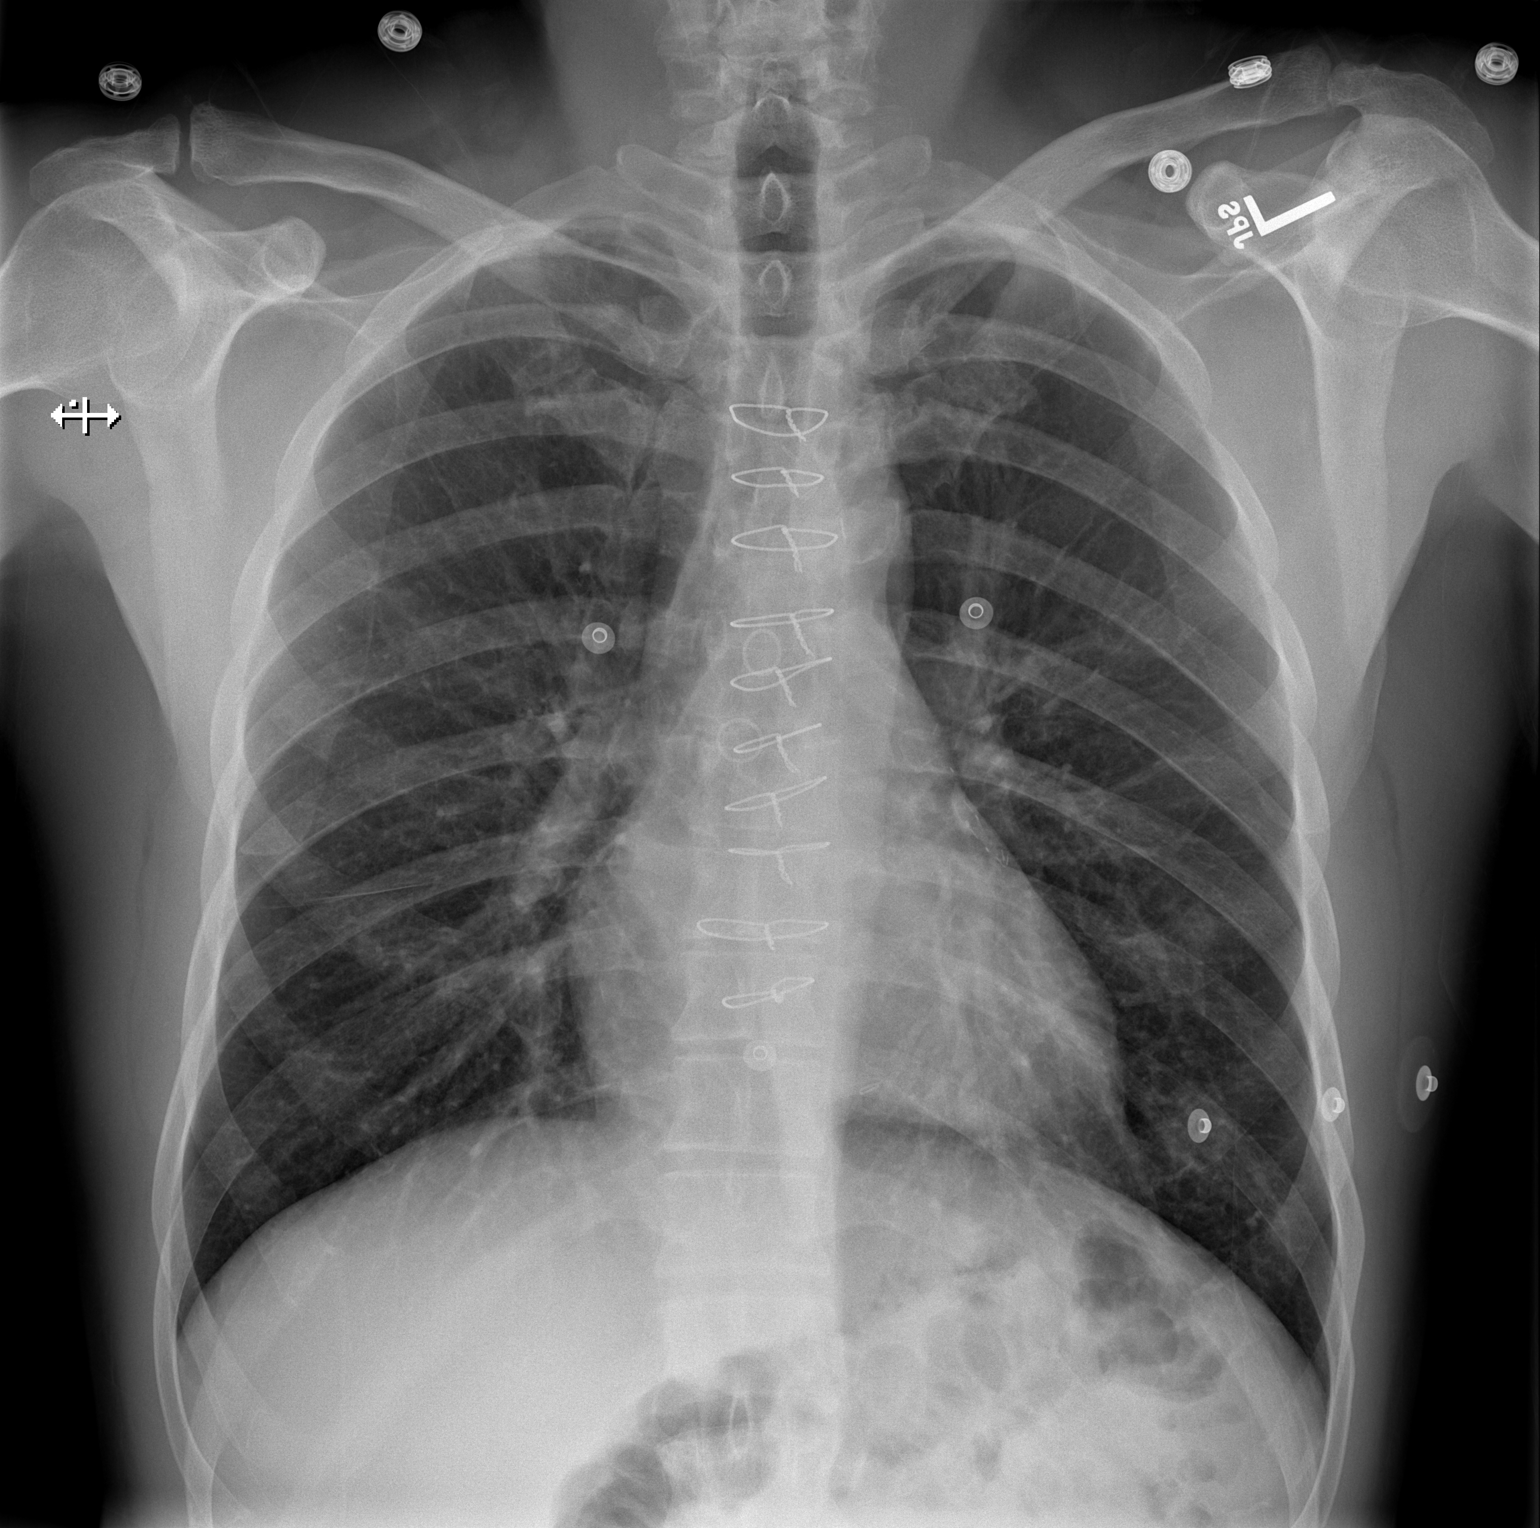

[2 of 2 positions shown; findings below may reference images not displayed]

FINDINGS: Lungs are clear. Heart size and pulmonary vascularity are normal. No
adenopathy. No bone lesions. Patient is status post coronary artery
bypass grafting. No pneumothorax.
IMPRESSION: No edema or consolidation. No pneumothorax. Patient is status post
coronary artery bypass grafting.

## 2019-03-13 ENCOUNTER — Emergency Department (HOSPITAL_COMMUNITY)
Admission: EM | Admit: 2019-03-13 | Discharge: 2019-03-23 | Disposition: E | Payer: Self-pay | Attending: Emergency Medicine | Admitting: Emergency Medicine

## 2019-03-13 DIAGNOSIS — I2510 Atherosclerotic heart disease of native coronary artery without angina pectoris: Secondary | ICD-10-CM | POA: Insufficient documentation

## 2019-03-13 DIAGNOSIS — Z951 Presence of aortocoronary bypass graft: Secondary | ICD-10-CM | POA: Insufficient documentation

## 2019-03-13 DIAGNOSIS — I469 Cardiac arrest, cause unspecified: Secondary | ICD-10-CM | POA: Insufficient documentation

## 2019-03-13 DIAGNOSIS — F17210 Nicotine dependence, cigarettes, uncomplicated: Secondary | ICD-10-CM | POA: Insufficient documentation

## 2019-03-13 DIAGNOSIS — I119 Hypertensive heart disease without heart failure: Secondary | ICD-10-CM | POA: Insufficient documentation

## 2019-03-13 MED ORDER — EPINEPHRINE 1 MG/10ML IJ SOSY
PREFILLED_SYRINGE | INTRAMUSCULAR | Status: DC | PRN
  Administered 2019-03-13: 1 mg via INTRAVENOUS

## 2019-03-23 NOTE — ED Provider Notes (Signed)
[facility] EMERGENCY DEPARTMENT Provider Note   CSN: [id number] Arrival date & time: 04/06/2019  1313     History Chief Complaint  Patient presents with  . Cardiac Arrest    Gabriel Collier is a 59 y.o. male.  The history is provided by the EMS personnel. No language interpreter was used.  Cardiac Arrest  Gabriel Collier is a 59 y.o. male who presents to the Emergency Department complaining of cardiac arrest. Level V caveat due to unresponsiveness. History is provided by EMS. He presents to the emergency department CPR in progress following cardiac arrest. 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 On fire arrival he received CPR and was defibrillator at three times for V. fib. On EMS arrival at 1211 he was PEA and chest compressions were continued. He received multiple doses of epinephrine prior to ED arrival. [facility] airway and IO replaced by EMS prior to ED arrival.     Past Medical History:  Diagnosis Date  . 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 Coronary artery disease   . Hypertension     There are no problems to display for this patient.   Past Surgical History:  Procedure Laterality Date  . APPENDECTOMY    . CARDIAC SURGERY    . CORONARY ARTERY BYPASS GRAFT     triple       No family history on file.  Social History   Tobacco Use  . Smoking status: Current Every Day Smoker    Packs/day: 1.00    Types: Cigarettes  . Smokeless tobacco: Never Used  Substance Use Topics  . Alcohol use: Yes    Alcohol/week: 5.0 standard drinks    Types: 5 Cans of beer per week  . Drug use: No    Home Medications Prior to Admission medications   Medication Sig Start Date End Date Taking? Authorizing Provider  aspirin 325 MG EC tablet Take 325 mg by mouth daily.    [provider]  metoprolol (LOPRESSOR) 50 MG tablet Take 25 mg by mouth 2 (two) times daily.    [provider]  pravastatin (PRAVACHOL) 40 MG tablet Take 40 mg by mouth daily.    [provider]    Allergies    Patient has no known allergies.  Review of Systems   Review of Systems  Unable to perform ROS: Patient unresponsive    Physical Exam Updated Vital Signs Pulse (!) 103   Resp (!) 98   SpO2 (!) 50%   Physical Exam Vitals and nursing note reviewed.  Constitutional:      Appearance: He is ill-appearing.     Comments: Unresponsive.  HENT:     Head: Normocephalic and atraumatic.  Eyes:     Comments: Pupils fixed and dilated  Cardiovascular:     Comments: Femoral pulses present with CPR.  Pulmonary:     Comments: King airway in place. No spontaneous respirations. Fair air movement bilaterally with bagging. Abdominal:     Palpations: Abdomen is soft. There is no mass.     Tenderness: There is no guarding or rebound.  Musculoskeletal:        General: No swelling.     Comments: IO in the left anterior shin.  Skin:    General: Skin is dry.     Comments: Poor capillary refill  Neurological:     Comments: GCS 1-1-1     ED Results / Procedures /  Treatments   Labs (all labs ordered are listed, but only abnormal results are displayed) Labs Reviewed - No data to display  EKG None  Radiology No results found.  Procedures CPR  Date/Time: 03/28/2019 1:39 PM Performed by: Tilden Fossa, MD Authorized by: Tilden Fossa, MD  CPR Procedure Details:    ACLS/BLS initiated by EMS: Yes     CPR/ACLS performed in the ED: Yes     Outcome: Pt declared dead    CPR performed via ACLS guidelines under my direct supervision.  See RN documentation for details including defibrillator use, medications, doses and timing. Comments:     Please see nursing notes for details of CPR duration.   (including critical care time)  Medications Ordered in ED Medications  EPINEPHrine (ADRENALIN) 1 MG/10ML injection (1 mg Intravenous Given 2019/03/28 1318)    ED Course    I have reviewed the triage vital signs and the nursing notes.  Pertinent labs & imaging results that were available during my care of the patient were reviewed by me and considered in my medical decision making (see chart for details).    MDM Rules/Calculators/A&P                     Patient with history of coronary artery disease presents the emergency department CPR in progress following V. fib arrest with defibrillation times three. CPR started prior to EMS arrival and EMS arrived on scene at 1215. By the time patient arrived to the emergency department he had been in PEA and receiving CPR for about one hour. On ED arrival patient's airway confirmed by auscultation. Pupils fixed and dilated, end tidal CO2 18. Rhythm is slow PEA. Bedside ultrasound demonstrates no significant cardiac squeeze. Patient pronounced in the emergency department. Family notified.  Final Clinical Impression(s) / ED Diagnoses Final diagnoses:  Cardiac arrest with ventricular fibrillation [facility])    Rx / DC Orders ED Discharge Orders    None       Tilden Fossa, MD 28-Mar-2019 [phone]

## 2019-03-23 NOTE — Code Documentation (Signed)
PEA on the monitor. Bedside US shows no cardiac activity per MD Madilyn Hook.

## 2019-03-23 NOTE — ED Notes (Signed)
Pulse check shows asystole

## 2019-03-23 NOTE — Progress Notes (Signed)
Responded to ED page CPR. In progress. Per patient nurse patient had a massive heart attack. Patient Passed. Family presence and was escorted to bedside.  Provided emotional and spiritual support.  Provided nurse with necessary information needed .   Venida Jarvis, [place], [facility], Pager [phone]

## 2019-03-23 NOTE — ED Triage Notes (Signed)
Pt arrives via ems cpr in progress. Per ems, pt was shocked X3 by FD for vfib prior to their arrival on scene. On scene pt in PEA. Total of epi X6 and 500cc NS given en route. King airway in place. CPR for approx 1 hour prior to arrival.

## 2019-03-23 NOTE — ED Notes (Signed)
Patient time of death occurred at 27.

## 2019-03-23 DEATH — deceased
